# Patient Record
Sex: Female | Born: 1965 | Race: White | Hispanic: No | Marital: Married
Health system: Southern US, Community
[De-identification: ages and names within clinical notes are randomized; demographics above are authoritative.]

---

## 2008-11-14 ENCOUNTER — Encounter: Admission: RE | Admit: 2008-11-14 | Discharge: 2008-11-14 | Payer: Self-pay | Admitting: Family Medicine

## 2010-11-01 ENCOUNTER — Other Ambulatory Visit: Payer: Self-pay | Admitting: Family Medicine

## 2010-11-01 DIAGNOSIS — Z1231 Encounter for screening mammogram for malignant neoplasm of breast: Secondary | ICD-10-CM

## 2010-11-05 ENCOUNTER — Ambulatory Visit: Payer: Self-pay

## 2010-11-17 ENCOUNTER — Ambulatory Visit: Payer: Self-pay

## 2010-11-23 ENCOUNTER — Ambulatory Visit
Admission: RE | Admit: 2010-11-23 | Discharge: 2010-11-23 | Disposition: A | Discharge status: Home / Self Care | Payer: Managed Care, Other (non HMO) | Source: Ambulatory Visit | Attending: Family Medicine | Admitting: Family Medicine

## 2010-11-23 DIAGNOSIS — Z1231 Encounter for screening mammogram for malignant neoplasm of breast: Secondary | ICD-10-CM

## 2012-03-07 ENCOUNTER — Other Ambulatory Visit: Payer: Self-pay | Admitting: Family Medicine

## 2012-03-07 DIAGNOSIS — Z139 Encounter for screening, unspecified: Secondary | ICD-10-CM

## 2012-03-13 ENCOUNTER — Ambulatory Visit (INDEPENDENT_AMBULATORY_CARE_PROVIDER_SITE_OTHER): Payer: Managed Care, Other (non HMO)

## 2012-03-13 DIAGNOSIS — Z1231 Encounter for screening mammogram for malignant neoplasm of breast: Secondary | ICD-10-CM

## 2012-03-13 DIAGNOSIS — R928 Other abnormal and inconclusive findings on diagnostic imaging of breast: Secondary | ICD-10-CM

## 2012-03-13 DIAGNOSIS — Z139 Encounter for screening, unspecified: Secondary | ICD-10-CM

## 2012-03-30 ENCOUNTER — Other Ambulatory Visit: Payer: Self-pay | Admitting: Family Medicine

## 2012-03-30 DIAGNOSIS — R928 Other abnormal and inconclusive findings on diagnostic imaging of breast: Secondary | ICD-10-CM

## 2012-04-10 ENCOUNTER — Ambulatory Visit
Admission: RE | Admit: 2012-04-10 | Discharge: 2012-04-10 | Disposition: A | Discharge status: Home / Self Care | Payer: Managed Care, Other (non HMO) | Source: Ambulatory Visit | Attending: Family Medicine | Admitting: Family Medicine

## 2012-04-10 DIAGNOSIS — R928 Other abnormal and inconclusive findings on diagnostic imaging of breast: Secondary | ICD-10-CM

## 2012-09-26 ENCOUNTER — Other Ambulatory Visit: Payer: Self-pay | Admitting: Family Medicine

## 2012-09-26 DIAGNOSIS — N63 Unspecified lump in unspecified breast: Secondary | ICD-10-CM

## 2012-10-05 ENCOUNTER — Ambulatory Visit
Admission: RE | Admit: 2012-10-05 | Discharge: 2012-10-05 | Disposition: A | Discharge status: Home / Self Care | Payer: Managed Care, Other (non HMO) | Source: Ambulatory Visit | Attending: Family Medicine | Admitting: Family Medicine

## 2012-10-05 DIAGNOSIS — N63 Unspecified lump in unspecified breast: Secondary | ICD-10-CM

## 2013-05-17 ENCOUNTER — Other Ambulatory Visit: Payer: Self-pay

## 2013-05-17 DIAGNOSIS — Z1231 Encounter for screening mammogram for malignant neoplasm of breast: Secondary | ICD-10-CM

## 2013-06-03 ENCOUNTER — Ambulatory Visit
Admission: RE | Admit: 2013-06-03 | Discharge: 2013-06-03 | Disposition: A | Discharge status: Home / Self Care | Payer: Private Health Insurance - Indemnity | Source: Ambulatory Visit

## 2013-06-03 DIAGNOSIS — Z1231 Encounter for screening mammogram for malignant neoplasm of breast: Secondary | ICD-10-CM

## 2015-06-30 ENCOUNTER — Other Ambulatory Visit: Payer: Self-pay

## 2015-06-30 DIAGNOSIS — Z1231 Encounter for screening mammogram for malignant neoplasm of breast: Secondary | ICD-10-CM

## 2015-07-17 ENCOUNTER — Ambulatory Visit
Admission: RE | Admit: 2015-07-17 | Discharge: 2015-07-17 | Disposition: A | Discharge status: Home / Self Care | Payer: Managed Care, Other (non HMO) | Source: Ambulatory Visit

## 2015-07-17 DIAGNOSIS — Z1231 Encounter for screening mammogram for malignant neoplasm of breast: Secondary | ICD-10-CM

## 2015-07-20 ENCOUNTER — Other Ambulatory Visit: Payer: Self-pay | Admitting: Family Medicine

## 2015-07-20 DIAGNOSIS — R5381 Other malaise: Secondary | ICD-10-CM

## 2015-07-20 DIAGNOSIS — N63 Unspecified lump in unspecified breast: Secondary | ICD-10-CM

## 2015-07-22 ENCOUNTER — Other Ambulatory Visit: Payer: Self-pay | Admitting: Family Medicine

## 2015-07-22 DIAGNOSIS — N63 Unspecified lump in unspecified breast: Secondary | ICD-10-CM

## 2015-07-29 ENCOUNTER — Ambulatory Visit
Admission: RE | Admit: 2015-07-29 | Discharge: 2015-07-29 | Disposition: A | Discharge status: Home / Self Care | Payer: Managed Care, Other (non HMO) | Source: Ambulatory Visit | Attending: Family Medicine | Admitting: Family Medicine

## 2015-07-29 DIAGNOSIS — N63 Unspecified lump in unspecified breast: Secondary | ICD-10-CM

## 2021-07-16 ENCOUNTER — Emergency Department (HOSPITAL_COMMUNITY): Payer: 59

## 2021-07-16 ENCOUNTER — Encounter (HOSPITAL_COMMUNITY): Payer: Self-pay | Admitting: Emergency Medicine

## 2021-07-16 ENCOUNTER — Emergency Department (HOSPITAL_COMMUNITY)
Admission: EM | Admit: 2021-07-16 | Discharge: 2021-07-16 | Disposition: A | Discharge status: Home / Self Care | Payer: 59 | Attending: Emergency Medicine | Admitting: Emergency Medicine

## 2021-07-16 DIAGNOSIS — S92335A Nondisplaced fracture of third metatarsal bone, left foot, initial encounter for closed fracture: Secondary | ICD-10-CM | POA: Insufficient documentation

## 2021-07-16 DIAGNOSIS — S82892A Other fracture of left lower leg, initial encounter for closed fracture: Secondary | ICD-10-CM

## 2021-07-16 DIAGNOSIS — S92325A Nondisplaced fracture of second metatarsal bone, left foot, initial encounter for closed fracture: Secondary | ICD-10-CM | POA: Insufficient documentation

## 2021-07-16 DIAGNOSIS — S82852A Displaced trimalleolar fracture of left lower leg, initial encounter for closed fracture: Secondary | ICD-10-CM | POA: Insufficient documentation

## 2021-07-16 DIAGNOSIS — S92212A Displaced fracture of cuboid bone of left foot, initial encounter for closed fracture: Secondary | ICD-10-CM | POA: Diagnosis not present

## 2021-07-16 DIAGNOSIS — S99912A Unspecified injury of left ankle, initial encounter: Secondary | ICD-10-CM | POA: Diagnosis present

## 2021-07-16 DIAGNOSIS — W182XXA Fall in (into) shower or empty bathtub, initial encounter: Secondary | ICD-10-CM | POA: Diagnosis not present

## 2021-07-16 DIAGNOSIS — S92902A Unspecified fracture of left foot, initial encounter for closed fracture: Secondary | ICD-10-CM

## 2021-07-16 MED ORDER — NAPROXEN 375 MG PO TABS: 375.0000 mg | ORAL_TABLET | Freq: Two times a day (BID) | ORAL | 0 refills | Status: AC

## 2021-07-16 MED ORDER — OXYCODONE-ACETAMINOPHEN 5-325 MG PO TABS: 1.0000 | ORAL_TABLET | Freq: Four times a day (QID) | ORAL | 0 refills | Status: AC | PRN

## 2021-07-16 NOTE — ED Notes (Signed)
Ortho tech paged for splint.

## 2021-07-16 NOTE — ED Provider Notes (Signed)
?[facility]-EMERGENCY DEPT ?Provider Note ? ? ?CSN: [id number] ?Arrival date & time: 07/16/21  1514 ? ?  ? ?History ? ?Chief Complaint  ?Patient presents with  ? Fall  ? ? ?Coral CeoJill A Klinck is a 56 y.o. female. ? ? ?Fall ? ? ?Patient presented to ED with complaints of left ankle pain.  Patient fell out of the shower a couple days ago.  Patient states she is she has had persistent pain in her left ankle.  She initially planned on going to an urgent care but she could not put any weight on her ankle today so she came to the ED.  Patient has noticed significant swelling in her ankle and her foot.  EMS gave her 100 mcg of fentanyl prior to arrival.  Patient denies any other injuries ? ?Home Medications ?Prior to Admission medications   ?Medication Sig Start Date End Date Taking? Authorizing Provider  ?naproxen (NAPROSYN) 375 MG tablet Take 1 tablet (375 mg total) by mouth 2 (two) times daily. 07/16/21  Yes Linwood DibblesKnapp, Graceyn Fodor, MD  ?oxyCODONE-acetaminophen (PERCOCET/ROXICET) 5-325 MG tablet Take 1 tablet by mouth every 6 (six) hours as needed for severe pain. 07/16/21  Yes Linwood DibblesKnapp, Jeymi Hepp, MD  ?   ? ?Allergies    ?Patient has no known allergies.   ? ?Review of Systems   ?Review of Systems  ?All other systems reviewed and are negative. ? ?Physical Exam ?Updated Vital Signs ?BP (!) 149/100   Pulse (!) 119   Temp 98.2 ?F (36.8 ?C)   Resp 16   SpO2 95%  ?Physical Exam ?Vitals and nursing note reviewed.  ?Constitutional:   ?   General: She is not in acute distress. ?   Appearance: She is well-developed.  ?HENT:  ?   Head: Normocephalic and atraumatic.  ?   Right Ear: External ear normal.  ?   Left Ear: External ear normal.  ?Eyes:  ?   General: No scleral icterus.    ?   Right eye: No discharge.     ?   Left eye: No discharge.  ?   Conjunctiva/sclera: Conjunctivae normal.  ?Neck:  ?   Trachea: No tracheal deviation.  ?Cardiovascular:  ?   Rate and Rhythm: Normal rate.  ?Pulmonary:  ?   Effort: Pulmonary effort is normal.  No respiratory distress.  ?   Breath sounds: No stridor.  ?Abdominal:  ?   General: There is no distension.  ?Musculoskeletal:     ?   General: No swelling or deformity.  ?   Cervical back: Neck supple.  ?   Left ankle: Swelling present. Tenderness present over the lateral malleolus, medial malleolus and base of 5th metatarsal. No proximal fibula tenderness. Decreased range of motion.  ?   Comments: Tenderness palpation left ankle and left midfoot, ecchymoses noted in the ankle and foot, no tenderness palpation proximal fibula  ?Skin: ?   General: Skin is warm and dry.  ?   Findings: No rash.  ?Neurological:  ?   Mental Status: She is alert.  ?   Cranial Nerves: Cranial nerve deficit: no gross deficits.  ? ? ?ED Results / Procedures / Treatments   ?Labs ?(all labs ordered are listed, but only abnormal results are displayed) ?Labs Reviewed - No data to display ? ?EKG ?None ? ?Radiology ?DG Ankle Complete Left ? ?Result Date: 07/16/2021 ?CLINICAL DATA:  Kathryn Huffman getting out of the shower 2 days ago, left foot and ankle pain, inability to bear weight  EXAM: LEFT ANKLE COMPLETE - 3+ VIEW; LEFT FOOT - COMPLETE 3+ VIEW COMPARISON:  None. FINDINGS: Left ankle: Frontal, oblique, lateral views are obtained. A trimalleolar left ankle fracture is identified. A comminuted oblique distal fibular diaphyseal fracture demonstrates mild displacement along the fracture line. There is an oblique minimally displaced medial malleolar fracture. A nondisplaced posterior malleolar fracture in the coronal plane is seen on lateral view. There is diffuse soft tissue swelling. The ankle mortise is grossly intact. Left foot: Frontal, oblique, lateral views are obtained. There are transverse nondisplaced extra-articular fractures at the base of the second and third metatarsals. On the lateral view, a minimally displaced intra-articular fracture is seen involving the cuboid, likely extending to the fifth tarsometatarsal joint. There are no other acute  displaced fractures identified. Diffuse soft tissue swelling. IMPRESSION: 1. Trimalleolar left ankle fracture as above. The ankle mortise remains grossly intact. 2. Nondisplaced transverse extra-articular fractures at the base of the second and third metatarsals. 3. Minimally displaced intra-articular cuboid fracture, likely extending into the fifth tarsometatarsal joint space. 4. Diffuse soft tissue swelling of the left foot and ankle. Electronically Signed   By: Sharlet Salina M.D.   On: 07/16/2021 16:16  ? ?DG Foot Complete Left ? ?Result Date: 07/16/2021 ?CLINICAL DATA:  Kathryn Huffman getting out of the shower 2 days ago, left foot and ankle pain, inability to bear weight EXAM: LEFT ANKLE COMPLETE - 3+ VIEW; LEFT FOOT - COMPLETE 3+ VIEW COMPARISON:  None. FINDINGS: Left ankle: Frontal, oblique, lateral views are obtained. A trimalleolar left ankle fracture is identified. A comminuted oblique distal fibular diaphyseal fracture demonstrates mild displacement along the fracture line. There is an oblique minimally displaced medial malleolar fracture. A nondisplaced posterior malleolar fracture in the coronal plane is seen on lateral view. There is diffuse soft tissue swelling. The ankle mortise is grossly intact. Left foot: Frontal, oblique, lateral views are obtained. There are transverse nondisplaced extra-articular fractures at the base of the second and third metatarsals. On the lateral view, a minimally displaced intra-articular fracture is seen involving the cuboid, likely extending to the fifth tarsometatarsal joint. There are no other acute displaced fractures identified. Diffuse soft tissue swelling. IMPRESSION: 1. Trimalleolar left ankle fracture as above. The ankle mortise remains grossly intact. 2. Nondisplaced transverse extra-articular fractures at the base of the second and third metatarsals. 3. Minimally displaced intra-articular cuboid fracture, likely extending into the fifth tarsometatarsal joint space. 4.  Diffuse soft tissue swelling of the left foot and ankle. Electronically Signed   By: Sharlet Salina M.D.   On: 07/16/2021 16:16   ? ?Procedures ?Procedures  ? ? ?Medications Ordered in ED ?Medications - No data to display ? ?ED Course/ Medical Decision Making/ A&P ?Clinical Course as of 07/16/21 1724  ?Fri Jul 16, 2021  ?1622 Left foot and ankle images and radiology report reviewed.  IMPRESSION: ?1. Trimalleolar left ankle fracture as above. The ankle mortise ?remains grossly intact. ?2. Nondisplaced transverse extra-articular fractures at the base of ?the second and third metatarsals. ?3. Minimally displaced intra-articular cuboid fracture, likely ?extending into the fifth tarsometatarsal joint space. ?4. Diffuse soft tissue swelling of the left foot and ankle. ? ? [JK]  ?[id number] Reviewed case with Dr Dion Saucier.  OK for outpatient follow up.  Recommend plaster splint [JK]  ?  ?Clinical Course User Index ?[JK] Linwood Dibbles, MD  ? ?                        ?  Medical Decision Making ?Amount and/or Complexity of Data Reviewed ?Radiology: ordered. ?Discussion of management or test interpretation with external provider(s): Case discussed with Dr. Dion Saucier.  Patient splinted as directed.  Will need to follow-up with Dr. Susa Simmonds as an outpatient for surgical treatment ? ?Risk ?Prescription drug management. ? ?Patient presented to the ED for evaluation of an ankle fracture.  Noted to have trimalleolar fracture as well as foot fractures.  Case was discussed with orthopedics.  Patient stable for outpatient management.  Crutches and splint provided. ? ? ? ? ? ? ? ?Final Clinical Impression(s) / ED Diagnoses ?Final diagnoses:  ?Closed fracture of left ankle, initial encounter  ?Closed fracture of left foot, initial encounter  ? ? ?Rx / DC Orders ?ED Discharge Orders   ? ?      Ordered  ?  oxyCODONE-acetaminophen (PERCOCET/ROXICET) 5-325 MG tablet  Every 6 hours PRN       ? 07/16/21 1722  ?  naproxen (NAPROSYN) 375 MG tablet  2 times daily        ? 07/16/21 1722  ? ?  ?  ? ?  ? ? ?  ?Linwood Dibbles, MD ?07/16/21 1724 ? ?

## 2021-07-16 NOTE — Discharge Instructions (Signed)
Do not put any weight on your left ankle.  Make sure to keep the splint on at all times and to use the crutches.  Try to keep your leg elevated is much as possible to help with the swelling.  Follow-up with Dr. Susa Simmonds as we discussed for further treatment. ?

## 2021-07-16 NOTE — Progress Notes (Signed)
Reviewed films, discussed with EDP, plan plaster splint, CT of foot and ankle, and f/u with Dr. Lucia Gaskins next week for subspecialty foot and ankle care.  Will discuss with Dr. Lucia Gaskins.   ? ?Johnny Bridge, MD ? ?

## 2021-07-16 NOTE — ED Triage Notes (Addendum)
Per EMS, patient from home,reports fall getting out of the shower x2 days ago. C/o L ankle pain. States pain worsens with movement. ? ?20g R hand ?151mcg Fentanyl with EMS ?

## 2021-07-16 NOTE — Progress Notes (Signed)
Orthopedic Tech Progress Note ?Patient Details:  ?Kathryn Huffman ?24-Jan-1966 ?[id number] ? ?Ortho Devices ?Type of Ortho Device: Stirrup splint, Post (short leg) splint ?Ortho Device/Splint Location: LLE ?Ortho Device/Splint Interventions: Ordered, Application, Adjustment ?  ?Post Interventions ?Patient Tolerated: Fair, Well ?Instructions Provided: Care of device ? ?[name] ?07/16/2021, 5:52 PM ? ?

## 2023-04-07 IMAGING — CT CT FOOT*L* W/O CM
3 series · 13 of 35 positions shown, 16 images · non-contrast
Comparison: None.

CLINICAL DATA: known fracture, surgical planning; Ankle trauma,
fracture, xray done (Age >= 5y)

EXAM:
CT OF THE LEFT FOOT WITHOUT CONTRAST
CT OF THE LEFT ANKLE WITHOUT CONTRAST
TECHNIQUE: Multidetector CT imaging of the left foot and left ankle was
performed according to the standard protocol. Multiplanar CT image
reconstructions were also generated.
RADIATION DOSE REDUCTION: This exam was performed according to the
departmental dose-optimization program which includes automated
exposure control, adjustment of the mA and/or kV according to
patient size and/or use of iterative reconstruction technique.

[Series 4: axial st · axial · 0.35mm/px · z∈[+364,+554]mm · 5 of 137 slices shown, 7 images]
[im 21/137  soft-tissue]
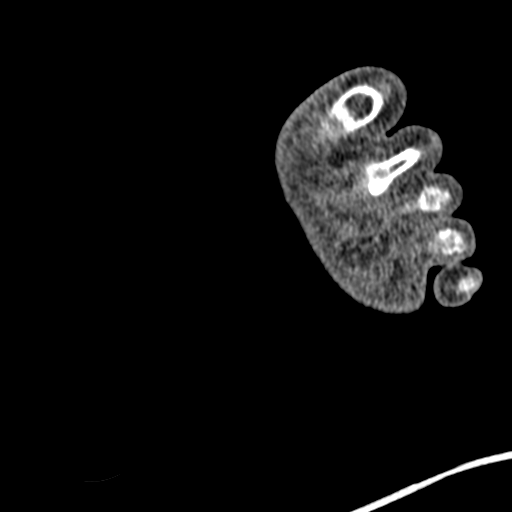
[im 21/137  bone]
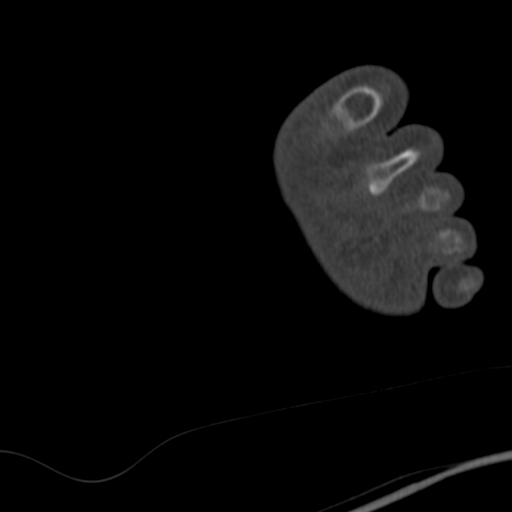
[im 42/137  bone]
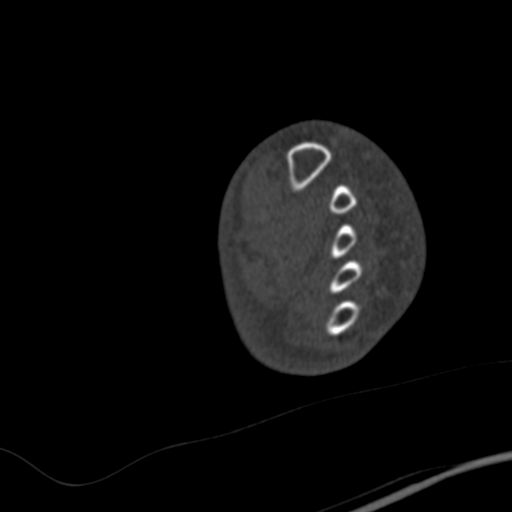
[im 74/137  bone]
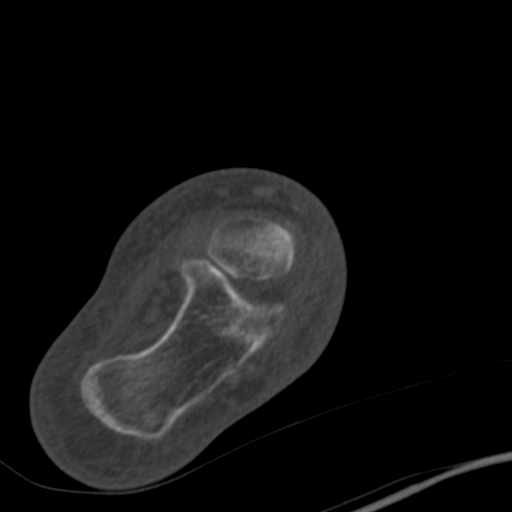
[im 95/137  bone]
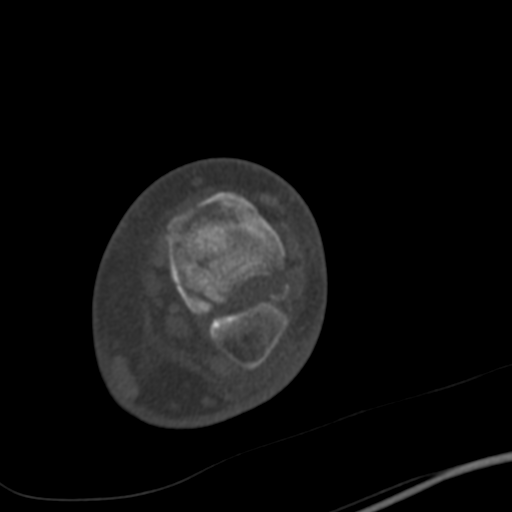
[im 116/137  soft-tissue]
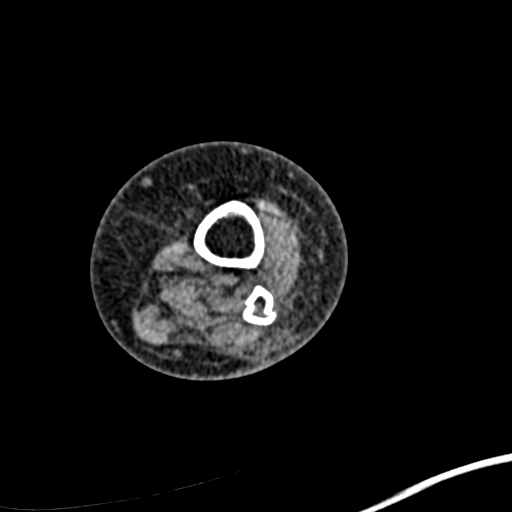
[im 116/137  bone]
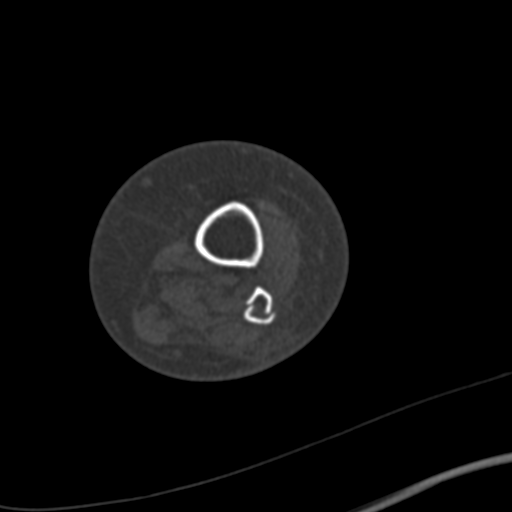

[Series 11: sagittal bone · coronal · 0.35mm/px · 3 of 78 slices shown]
[im 16/78  bone]
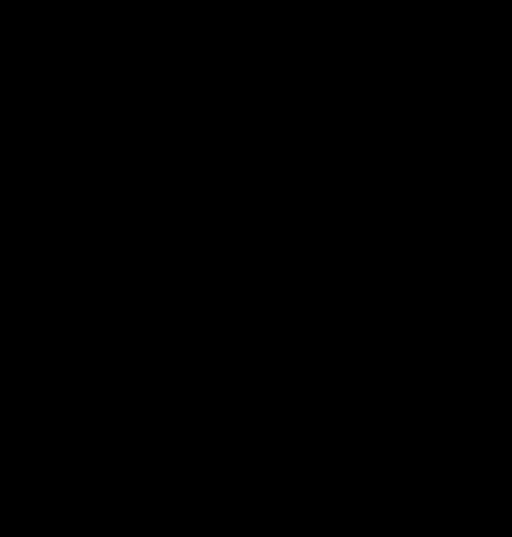
[im 31/78  bone]
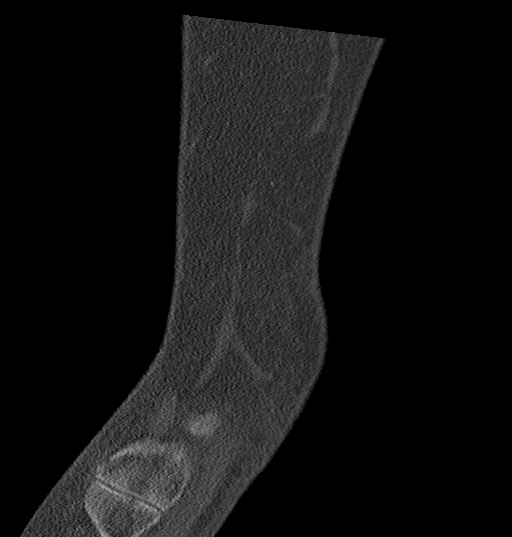
[im 47/78  bone]
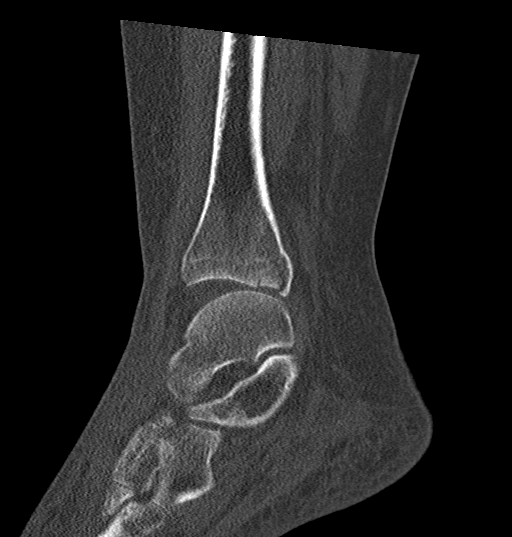

[Series 12: coronal bone · sagittal · 0.31mm/px · 5 of 91 slices shown, 6 images]
[im 23/91  bone]
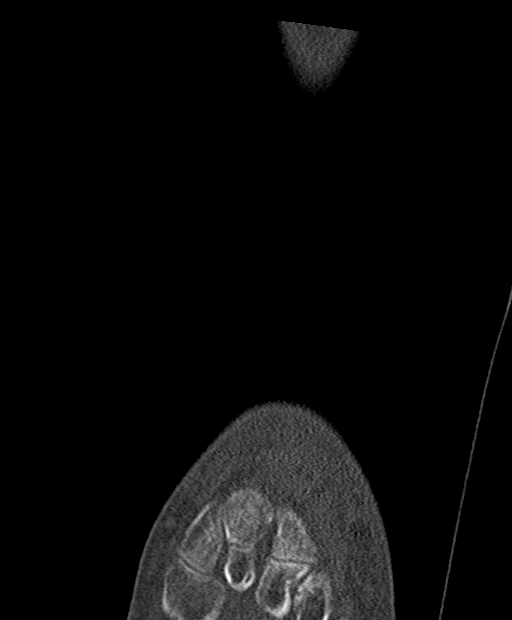
[im 31/91  bone]
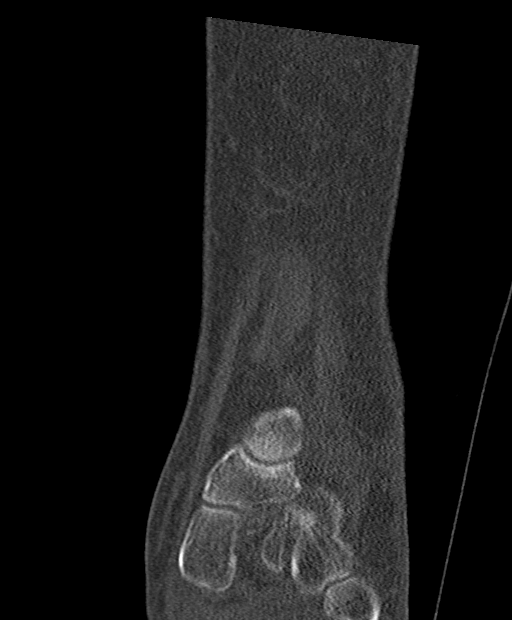
[im 38/91  bone]
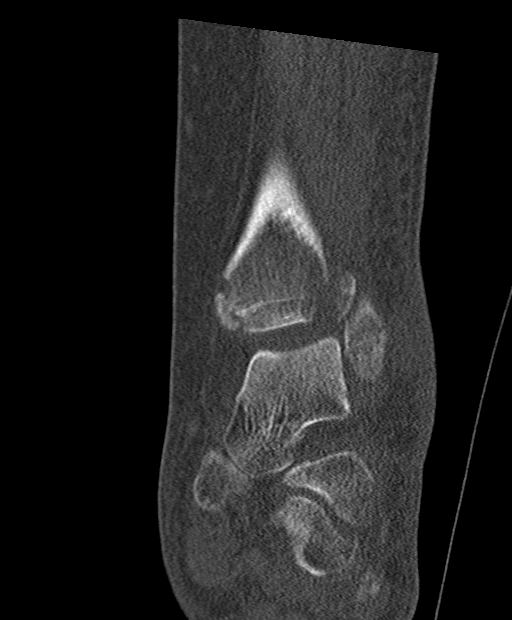
[im 46/91  soft-tissue]
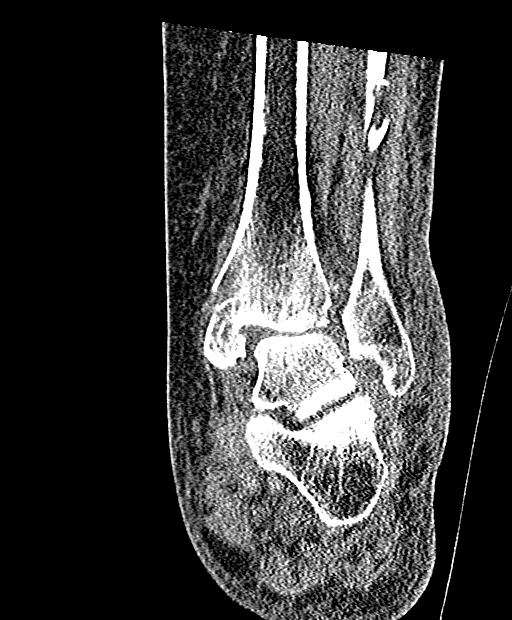
[im 46/91  bone]
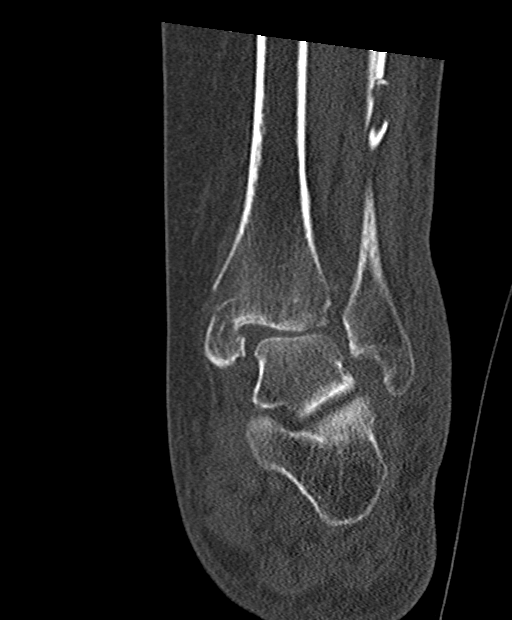
[im 53/91  bone]
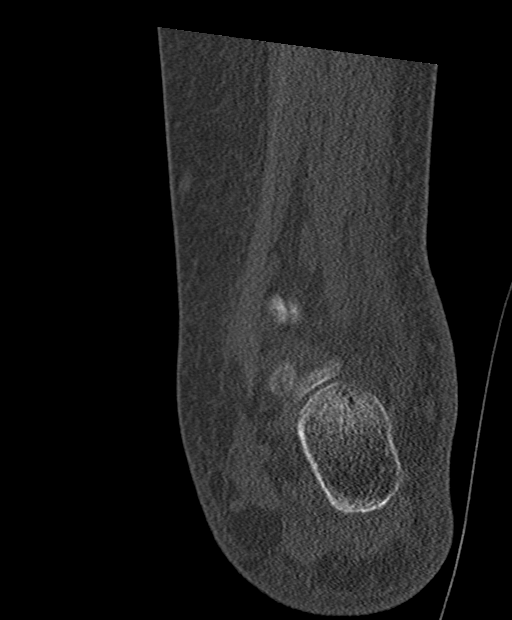

[13 of 35 positions shown; findings below may reference images not displayed]

FINDINGS: Bones/Joint/Cartilage

There is a trimalleolar ankle fracture. There is an obliquely
oriented, comminuted distal fibular fracture with up to 5 mm lateral
displacement. There is a mild displaced fracture of the base the
medial malleolus with up to 3 mm displacement. There is subtle
widening of the tibiotalar joint space. There is a posterior
malleolar fracture, nondisplaced and involving approximately 30% of
the articular surface. This fracture extends to in communicates with
the fracture of the base of the medial malleolus. Additionally,
there is a displaced fracture of the anterolateral distal tibia
consistent with an anterior tibiofibular ligament avulsion.

There are nondisplaced extra-articular fractures at the bases of the
second and third metatarsals. There is normal alignment of the
tarsometatarsal joints. No Lisfranc interval widening. There is a
nondisplaced fracture along the lateral aspect of the navicular
(series 14, image 49). There is a mildly displaced and proximal
dorsal cuboid fracture involving the calcaneocuboid joint.

Ligaments

Suboptimally assessed by CT.

Muscles and Tendons

No acute myotendinous abnormality at the ankle on noncontrast CT.
The extensor digitorum tendon abuts the anterolateral distal tibia
fracture but there is no tendon entrapment. No significant muscle
atrophy.

No acute myotendinous abnormality in the foot by CT. No significant
muscle atrophy.

Soft tissues

There is soft tissue swelling of the foot.
IMPRESSION: Trimalleolar ankle fracture as described above. Of note, the
posterior malleolar fracture component nondisplaced involves
approximately 30% of the articular surface. Additional displaced
fracture of the anterolateral distal tibia consistent with an
anterior tibiofibular ligament avulsion injury. Slight widening of
the tibiotalar joint suggestive of instability.

Nondisplaced extra-articular fractures at the bases of the second
and third metatarsals. Normal alignment of the tarsometatarsal
joints. No Lisfranc interval widening to suggest a Lisfranc injury.
MRI could be considered to evaluate for ligamentous integrity.

Mild displaced intra-articular cuboid fracture involving the
calcaneocuboid joint.

Nondisplaced fracture along the lateral aspect of the navicular.

## 2023-04-07 IMAGING — CT CT ANKLE*L* W/O CM
3 of 4 series · 13 of 35 positions shown, 16 images · non-contrast
Comparison: None.

CLINICAL DATA: known fracture, surgical planning; Ankle trauma,
fracture, xray done (Age >= 5y)

EXAM:
CT OF THE LEFT FOOT WITHOUT CONTRAST
CT OF THE LEFT ANKLE WITHOUT CONTRAST
TECHNIQUE: Multidetector CT imaging of the left foot and left ankle was
performed according to the standard protocol. Multiplanar CT image
reconstructions were also generated.
RADIATION DOSE REDUCTION: This exam was performed according to the
departmental dose-optimization program which includes automated
exposure control, adjustment of the mA and/or kV according to
patient size and/or use of iterative reconstruction technique.

[Series 1: coronal st · sagittal · 0.31mm/px · 5 of 91 slices shown, 6 images]
[im 31/91  bone]
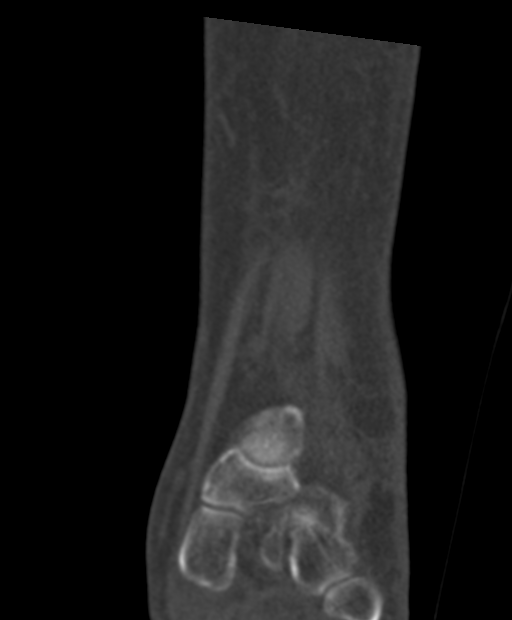
[im 38/91  bone]
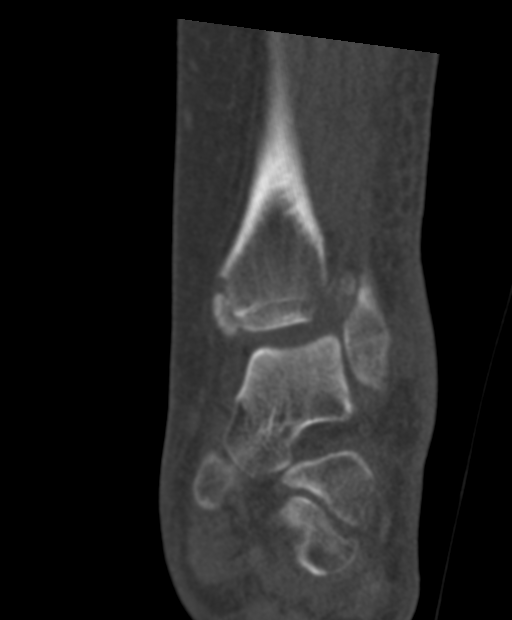
[im 46/91  soft-tissue]
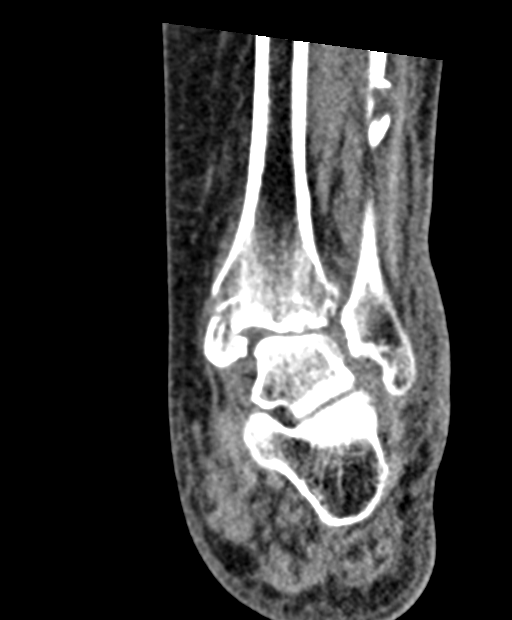
[im 46/91  bone]
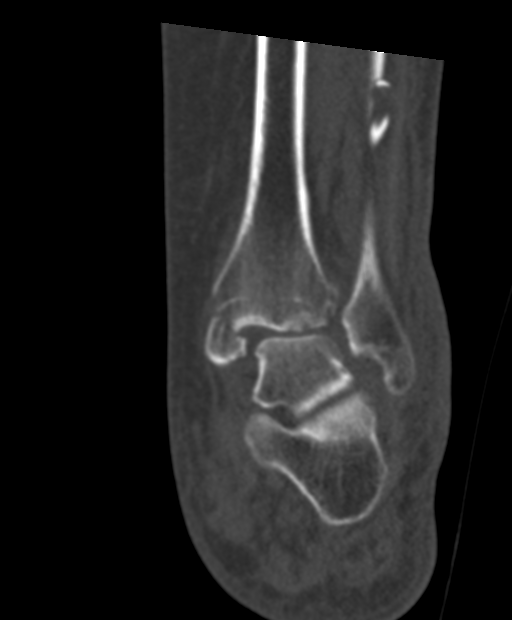
[im 53/91  bone]
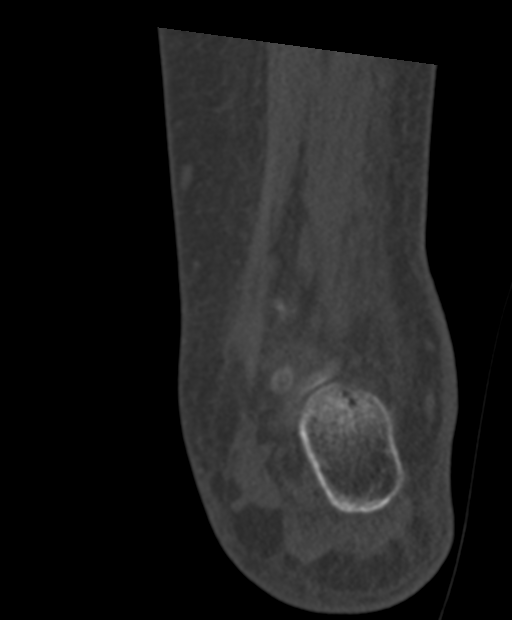
[im 61/91  bone]
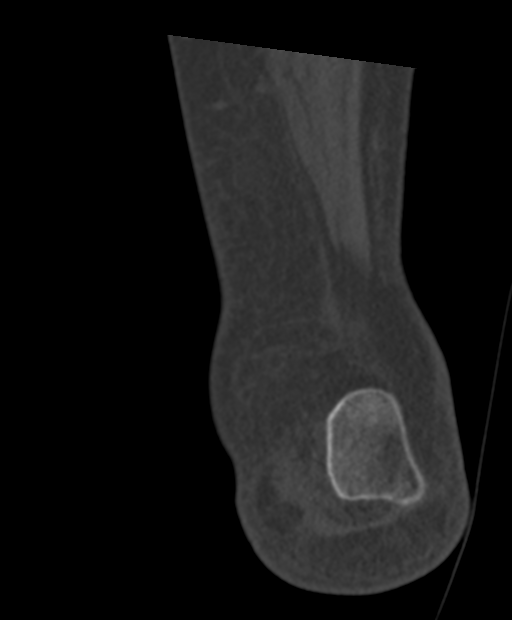

[Series 2: sagittal st · coronal · 0.35mm/px · 3 of 78 slices shown]
[im 16/78  bone]
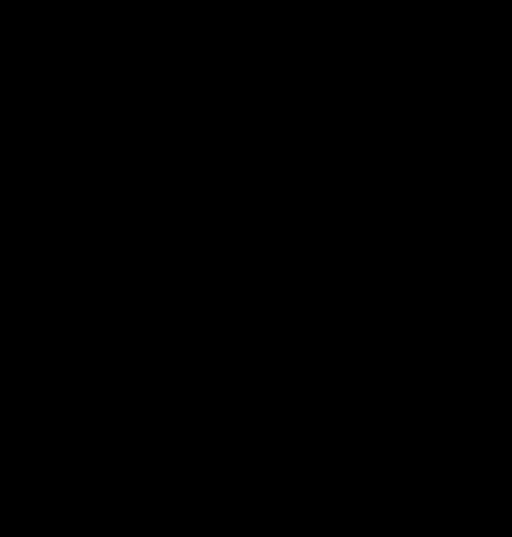
[im 31/78  bone]
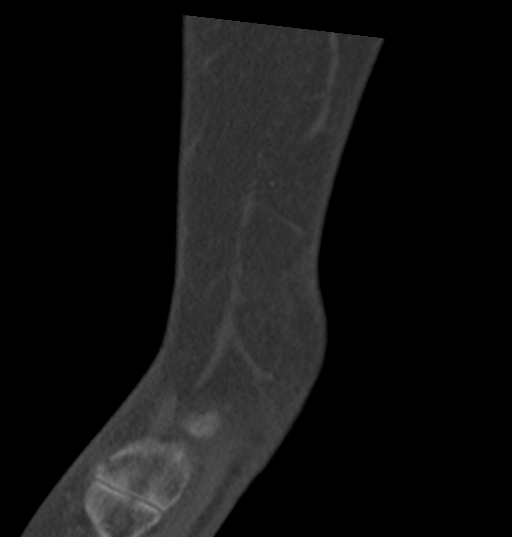
[im 47/78  bone]
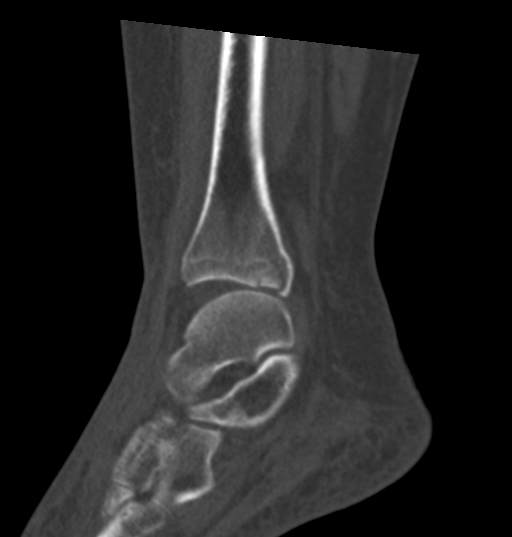

[Series 6: thins st · axial · 0.31mm/px · z∈[+469,+577]mm · 5 of 146 slices shown, 7 images]
[im 19/146  soft-tissue]
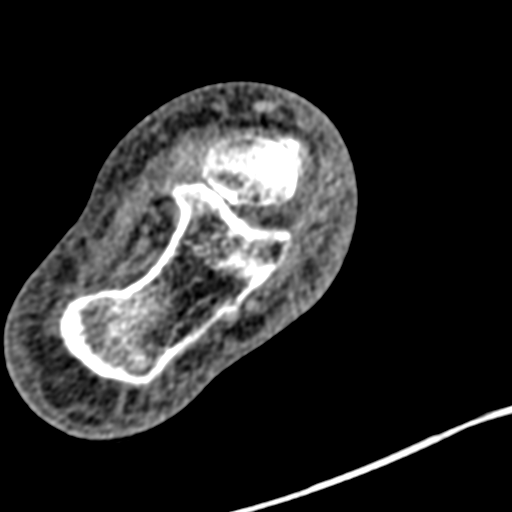
[im 19/146  bone]
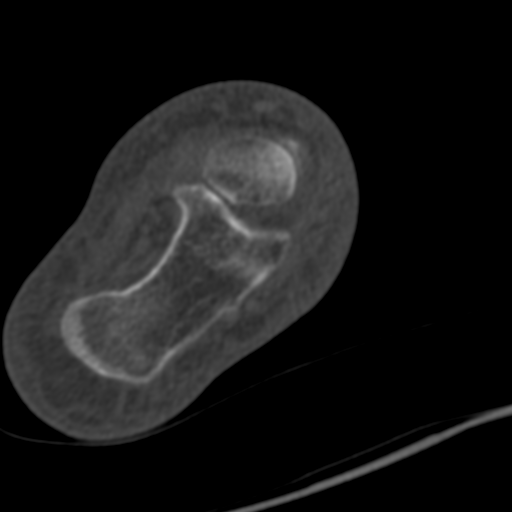
[im 55/146  bone]
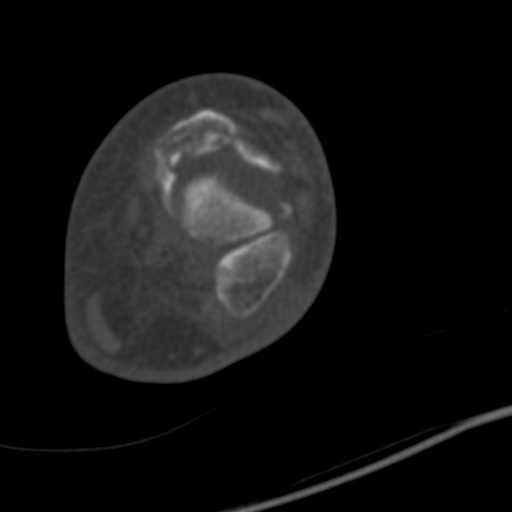
[im 73/146  bone]
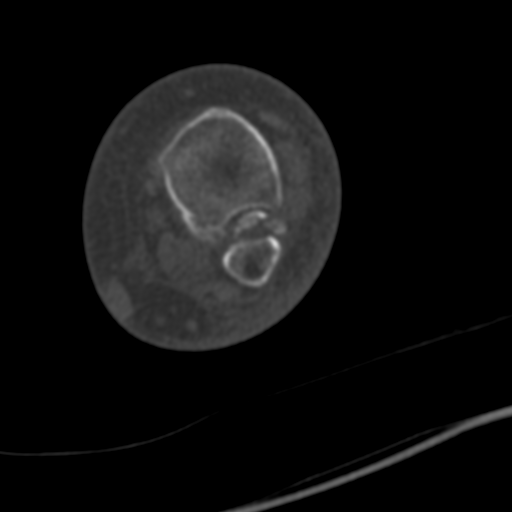
[im 91/146  bone]
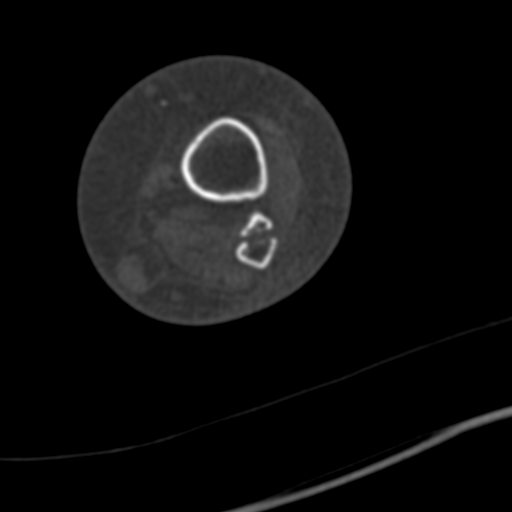
[im 127/146  soft-tissue]
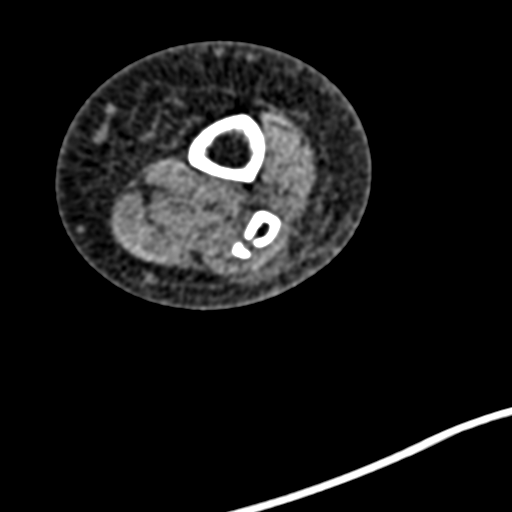
[im 127/146  bone]
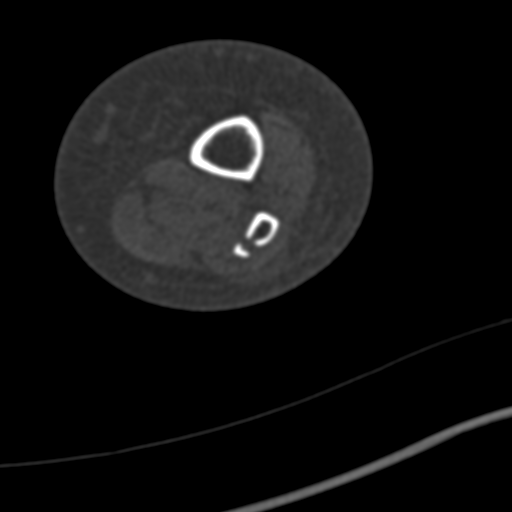

[13 of 35 positions shown; findings below may reference images not displayed]

FINDINGS: Bones/Joint/Cartilage

There is a trimalleolar ankle fracture. There is an obliquely
oriented, comminuted distal fibular fracture with up to 5 mm lateral
displacement. There is a mild displaced fracture of the base the
medial malleolus with up to 3 mm displacement. There is subtle
widening of the tibiotalar joint space. There is a posterior
malleolar fracture, nondisplaced and involving approximately 30% of
the articular surface. This fracture extends to in communicates with
the fracture of the base of the medial malleolus. Additionally,
there is a displaced fracture of the anterolateral distal tibia
consistent with an anterior tibiofibular ligament avulsion.

There are nondisplaced extra-articular fractures at the bases of the
second and third metatarsals. There is normal alignment of the
tarsometatarsal joints. No Lisfranc interval widening. There is a
nondisplaced fracture along the lateral aspect of the navicular
(series 14, image 49). There is a mildly displaced and proximal
dorsal cuboid fracture involving the calcaneocuboid joint.

Ligaments

Suboptimally assessed by CT.

Muscles and Tendons

No acute myotendinous abnormality at the ankle on noncontrast CT.
The extensor digitorum tendon abuts the anterolateral distal tibia
fracture but there is no tendon entrapment. No significant muscle
atrophy.

No acute myotendinous abnormality in the foot by CT. No significant
muscle atrophy.

Soft tissues

There is soft tissue swelling of the foot.
IMPRESSION: Trimalleolar ankle fracture as described above. Of note, the
posterior malleolar fracture component nondisplaced involves
approximately 30% of the articular surface. Additional displaced
fracture of the anterolateral distal tibia consistent with an
anterior tibiofibular ligament avulsion injury. Slight widening of
the tibiotalar joint suggestive of instability.

Nondisplaced extra-articular fractures at the bases of the second
and third metatarsals. Normal alignment of the tarsometatarsal
joints. No Lisfranc interval widening to suggest a Lisfranc injury.
MRI could be considered to evaluate for ligamentous integrity.

Mild displaced intra-articular cuboid fracture involving the
calcaneocuboid joint.

Nondisplaced fracture along the lateral aspect of the navicular.
